# Patient Record
Sex: Female | Born: 1965
Health system: Southern US, Community
[De-identification: ages and names within clinical notes are randomized; demographics above are authoritative.]

---

## 2004-01-10 ENCOUNTER — Other Ambulatory Visit: Admission: RE | Admit: 2004-01-10 | Discharge: 2004-01-10 | Payer: Self-pay | Admitting: Family Medicine

## 2009-09-11 ENCOUNTER — Encounter: Admission: RE | Admit: 2009-09-11 | Discharge: 2009-09-11 | Payer: Self-pay | Admitting: Family Medicine

## 2010-11-09 ENCOUNTER — Other Ambulatory Visit: Payer: Self-pay | Admitting: Family Medicine

## 2010-11-09 DIAGNOSIS — Z1231 Encounter for screening mammogram for malignant neoplasm of breast: Secondary | ICD-10-CM

## 2010-11-27 ENCOUNTER — Ambulatory Visit
Admission: RE | Admit: 2010-11-27 | Discharge: 2010-11-27 | Disposition: A | Discharge status: Home / Self Care | Payer: BC Managed Care – PPO | Source: Ambulatory Visit | Attending: Family Medicine | Admitting: Family Medicine

## 2010-11-27 DIAGNOSIS — Z1231 Encounter for screening mammogram for malignant neoplasm of breast: Secondary | ICD-10-CM

## 2010-12-01 ENCOUNTER — Other Ambulatory Visit: Payer: Self-pay | Admitting: Family Medicine

## 2010-12-01 DIAGNOSIS — R928 Other abnormal and inconclusive findings on diagnostic imaging of breast: Secondary | ICD-10-CM

## 2010-12-16 ENCOUNTER — Other Ambulatory Visit: Payer: BC Managed Care – PPO

## 2010-12-18 ENCOUNTER — Ambulatory Visit
Admission: RE | Admit: 2010-12-18 | Discharge: 2010-12-18 | Disposition: A | Discharge status: Home / Self Care | Payer: BC Managed Care – PPO | Source: Ambulatory Visit | Attending: Family Medicine | Admitting: Family Medicine

## 2010-12-18 DIAGNOSIS — R928 Other abnormal and inconclusive findings on diagnostic imaging of breast: Secondary | ICD-10-CM

## 2011-11-19 ENCOUNTER — Other Ambulatory Visit: Payer: Self-pay | Admitting: Family Medicine

## 2011-11-19 DIAGNOSIS — Z1231 Encounter for screening mammogram for malignant neoplasm of breast: Secondary | ICD-10-CM

## 2011-12-24 ENCOUNTER — Ambulatory Visit
Admission: RE | Admit: 2011-12-24 | Discharge: 2011-12-24 | Disposition: A | Discharge status: Home / Self Care | Payer: BC Managed Care – PPO | Source: Ambulatory Visit | Attending: Family Medicine | Admitting: Family Medicine

## 2011-12-24 DIAGNOSIS — Z1231 Encounter for screening mammogram for malignant neoplasm of breast: Secondary | ICD-10-CM

## 2011-12-28 ENCOUNTER — Other Ambulatory Visit: Payer: Self-pay | Admitting: Family Medicine

## 2011-12-28 DIAGNOSIS — R928 Other abnormal and inconclusive findings on diagnostic imaging of breast: Secondary | ICD-10-CM

## 2011-12-30 ENCOUNTER — Ambulatory Visit
Admission: RE | Admit: 2011-12-30 | Discharge: 2011-12-30 | Disposition: A | Discharge status: Home / Self Care | Payer: BC Managed Care – PPO | Source: Ambulatory Visit | Attending: Family Medicine | Admitting: Family Medicine

## 2011-12-30 DIAGNOSIS — R928 Other abnormal and inconclusive findings on diagnostic imaging of breast: Secondary | ICD-10-CM

## 2012-12-21 ENCOUNTER — Other Ambulatory Visit: Payer: Self-pay | Admitting: Family Medicine

## 2012-12-21 DIAGNOSIS — Z1231 Encounter for screening mammogram for malignant neoplasm of breast: Secondary | ICD-10-CM

## 2013-01-23 ENCOUNTER — Ambulatory Visit (INDEPENDENT_AMBULATORY_CARE_PROVIDER_SITE_OTHER): Payer: BC Managed Care – PPO

## 2013-01-23 DIAGNOSIS — Z1231 Encounter for screening mammogram for malignant neoplasm of breast: Secondary | ICD-10-CM

## 2013-01-23 DIAGNOSIS — R928 Other abnormal and inconclusive findings on diagnostic imaging of breast: Secondary | ICD-10-CM

## 2013-01-30 ENCOUNTER — Other Ambulatory Visit: Payer: Self-pay | Admitting: Family Medicine

## 2013-01-30 DIAGNOSIS — R928 Other abnormal and inconclusive findings on diagnostic imaging of breast: Secondary | ICD-10-CM

## 2013-02-13 ENCOUNTER — Ambulatory Visit
Admission: RE | Admit: 2013-02-13 | Discharge: 2013-02-13 | Disposition: A | Discharge status: Home / Self Care | Payer: BC Managed Care – PPO | Source: Ambulatory Visit | Attending: Family Medicine | Admitting: Family Medicine

## 2013-02-13 DIAGNOSIS — R928 Other abnormal and inconclusive findings on diagnostic imaging of breast: Secondary | ICD-10-CM

## 2016-03-19 DIAGNOSIS — I10 Essential (primary) hypertension: Secondary | ICD-10-CM | POA: Diagnosis not present

## 2016-10-01 DIAGNOSIS — I10 Essential (primary) hypertension: Secondary | ICD-10-CM | POA: Diagnosis not present

## 2016-12-15 ENCOUNTER — Other Ambulatory Visit: Payer: Self-pay | Admitting: Family Medicine

## 2016-12-15 DIAGNOSIS — Z1231 Encounter for screening mammogram for malignant neoplasm of breast: Secondary | ICD-10-CM

## 2017-01-14 ENCOUNTER — Ambulatory Visit
Admission: RE | Admit: 2017-01-14 | Discharge: 2017-01-14 | Disposition: A | Discharge status: Home / Self Care | Payer: BLUE CROSS/BLUE SHIELD | Source: Ambulatory Visit | Attending: Family Medicine | Admitting: Family Medicine

## 2017-01-14 DIAGNOSIS — Z1231 Encounter for screening mammogram for malignant neoplasm of breast: Secondary | ICD-10-CM

## 2017-01-18 ENCOUNTER — Other Ambulatory Visit: Payer: Self-pay | Admitting: Family Medicine

## 2017-01-18 DIAGNOSIS — R928 Other abnormal and inconclusive findings on diagnostic imaging of breast: Secondary | ICD-10-CM

## 2017-01-21 ENCOUNTER — Ambulatory Visit
Admission: RE | Admit: 2017-01-21 | Discharge: 2017-01-21 | Disposition: A | Discharge status: Home / Self Care | Payer: BLUE CROSS/BLUE SHIELD | Source: Ambulatory Visit | Attending: Family Medicine | Admitting: Family Medicine

## 2017-01-21 DIAGNOSIS — R928 Other abnormal and inconclusive findings on diagnostic imaging of breast: Secondary | ICD-10-CM | POA: Diagnosis not present

## 2017-01-21 DIAGNOSIS — N6002 Solitary cyst of left breast: Secondary | ICD-10-CM | POA: Diagnosis not present

## 2017-03-04 DIAGNOSIS — R05 Cough: Secondary | ICD-10-CM | POA: Diagnosis not present

## 2017-03-04 DIAGNOSIS — J069 Acute upper respiratory infection, unspecified: Secondary | ICD-10-CM | POA: Diagnosis not present

## 2017-03-15 DIAGNOSIS — R05 Cough: Secondary | ICD-10-CM | POA: Diagnosis not present

## 2017-04-28 DIAGNOSIS — G4733 Obstructive sleep apnea (adult) (pediatric): Secondary | ICD-10-CM | POA: Diagnosis not present

## 2017-09-30 DIAGNOSIS — M25511 Pain in right shoulder: Secondary | ICD-10-CM | POA: Diagnosis not present

## 2017-09-30 DIAGNOSIS — Z1211 Encounter for screening for malignant neoplasm of colon: Secondary | ICD-10-CM | POA: Diagnosis not present

## 2017-09-30 DIAGNOSIS — I10 Essential (primary) hypertension: Secondary | ICD-10-CM | POA: Diagnosis not present

## 2017-12-14 ENCOUNTER — Other Ambulatory Visit: Payer: Self-pay | Admitting: Family Medicine

## 2017-12-14 DIAGNOSIS — Z1231 Encounter for screening mammogram for malignant neoplasm of breast: Secondary | ICD-10-CM

## 2018-01-26 ENCOUNTER — Ambulatory Visit
Admission: RE | Admit: 2018-01-26 | Discharge: 2018-01-26 | Disposition: A | Discharge status: Home / Self Care | Payer: BLUE CROSS/BLUE SHIELD | Source: Ambulatory Visit | Attending: Family Medicine | Admitting: Family Medicine

## 2018-01-26 DIAGNOSIS — Z1231 Encounter for screening mammogram for malignant neoplasm of breast: Secondary | ICD-10-CM

## 2018-12-26 ENCOUNTER — Other Ambulatory Visit: Payer: Self-pay | Admitting: Family Medicine

## 2018-12-26 DIAGNOSIS — Z1231 Encounter for screening mammogram for malignant neoplasm of breast: Secondary | ICD-10-CM

## 2019-02-20 ENCOUNTER — Other Ambulatory Visit: Payer: Self-pay

## 2019-02-20 ENCOUNTER — Ambulatory Visit
Admission: RE | Admit: 2019-02-20 | Discharge: 2019-02-20 | Disposition: A | Discharge status: Home / Self Care | Payer: 59 | Source: Ambulatory Visit | Attending: Family Medicine | Admitting: Family Medicine

## 2019-02-20 DIAGNOSIS — Z1231 Encounter for screening mammogram for malignant neoplasm of breast: Secondary | ICD-10-CM

## 2019-02-21 ENCOUNTER — Other Ambulatory Visit: Payer: Self-pay | Admitting: Family Medicine

## 2019-02-21 DIAGNOSIS — R928 Other abnormal and inconclusive findings on diagnostic imaging of breast: Secondary | ICD-10-CM

## 2019-03-02 ENCOUNTER — Other Ambulatory Visit: Payer: Self-pay

## 2019-03-02 ENCOUNTER — Ambulatory Visit
Admission: RE | Admit: 2019-03-02 | Discharge: 2019-03-02 | Disposition: A | Discharge status: Home / Self Care | Payer: 59 | Source: Ambulatory Visit | Attending: Family Medicine | Admitting: Family Medicine

## 2019-03-02 ENCOUNTER — Other Ambulatory Visit: Payer: Self-pay | Admitting: Family Medicine

## 2019-03-02 DIAGNOSIS — R928 Other abnormal and inconclusive findings on diagnostic imaging of breast: Secondary | ICD-10-CM

## 2019-03-02 DIAGNOSIS — N6310 Unspecified lump in the right breast, unspecified quadrant: Secondary | ICD-10-CM

## 2019-08-16 ENCOUNTER — Other Ambulatory Visit: Payer: Self-pay

## 2019-08-16 ENCOUNTER — Ambulatory Visit (INDEPENDENT_AMBULATORY_CARE_PROVIDER_SITE_OTHER): Payer: 59

## 2019-08-16 ENCOUNTER — Encounter: Payer: Self-pay | Admitting: Podiatry

## 2019-08-16 ENCOUNTER — Ambulatory Visit: Payer: 59 | Admitting: Podiatry

## 2019-08-16 DIAGNOSIS — M7661 Achilles tendinitis, right leg: Secondary | ICD-10-CM | POA: Diagnosis not present

## 2019-08-16 MED ORDER — DICLOFENAC SODIUM 75 MG PO TBEC
75.0000 mg | DELAYED_RELEASE_TABLET | Freq: Two times a day (BID) | ORAL | 2 refills | Status: AC
Start: 2019-08-16 — End: ?

## 2019-08-16 NOTE — Patient Instructions (Signed)

## 2019-08-17 NOTE — Progress Notes (Signed)
Subjective:   Patient ID: Jenny Shelton, female   DOB: 54 y.o.   MRN: [id number]   HPI Patient presents with caregiver with pain in the back of the right heel.  States is been going on for about 6 months but getting gradually worse over the last couple months and she does stand a lot at work.  Patient states that it is hard to wear shoe gear at this time and she does not smoke likes to be active   Review of Systems  All other systems reviewed and are negative.       Objective:  Physical Exam Vitals and nursing note reviewed.  Constitutional:      Appearance: She is well-developed.  Pulmonary:     Effort: Pulmonary effort is normal.  Musculoskeletal:        General: Normal range of motion.  Skin:    General: Skin is warm.  Neurological:     Mental Status: She is alert.     Neurovascular status intact muscle strength found to be adequate range of motion within normal limits with mild equinus condition bilateral.  Patient has exquisite discomfort on the posterior medial aspect of the right heel with minimal central or lateral band involvement.  Patient has mild flatfoot deformity and no other pathology was noted currently and has good digital perfusion well oriented x3     Assessment:  Acute Achilles tendinitis right medial side that is very tender when pressed     Plan:  H&P x-ray reviewed.  I discussed aggressive conservative and they want this approach and I did explain chances of rupture associated with it.  I went ahead and I did a sterile prep of the medial side of the posterior heel and carefully injected 3 mg dexamethasone 5 mg Xylocaine and then applied air fracture walker to completely immobilize.  I explained complete immobilization and patient will be seen back for [place] to recheck again in 3 weeks and may require other treatments depending on response to conservative care  X-rays indicate fairly large spur posterior aspect right heel which was discussed with  patient with no other pathology and also placed on oral diclofenac

## 2019-08-30 ENCOUNTER — Other Ambulatory Visit: Payer: Self-pay

## 2019-08-30 ENCOUNTER — Other Ambulatory Visit: Payer: Self-pay | Admitting: Family Medicine

## 2019-08-30 ENCOUNTER — Ambulatory Visit
Admission: RE | Admit: 2019-08-30 | Discharge: 2019-08-30 | Disposition: A | Discharge status: Home / Self Care | Payer: 59 | Source: Ambulatory Visit | Attending: Family Medicine | Admitting: Family Medicine

## 2019-08-30 DIAGNOSIS — N6310 Unspecified lump in the right breast, unspecified quadrant: Secondary | ICD-10-CM

## 2019-09-13 ENCOUNTER — Other Ambulatory Visit: Payer: Self-pay

## 2019-09-13 ENCOUNTER — Ambulatory Visit: Payer: 59 | Admitting: Podiatry

## 2019-09-13 ENCOUNTER — Encounter: Payer: Self-pay | Admitting: Podiatry

## 2019-09-13 DIAGNOSIS — M7661 Achilles tendinitis, right leg: Secondary | ICD-10-CM

## 2019-09-13 NOTE — Progress Notes (Signed)
Subjective:   Patient ID: Jenny Shelton, female   DOB: 54 y.o.   MRN: [id number]   HPI Patient presents stating she is feeling a lot better with mild discomfort and has been wearing the boot almost exclusively for the last 3 weeks   ROS      Objective:  Physical Exam  Neurovascular status intact with patient's right Achilles improved at its insertion with diminishment of edema and discomfort with palpation.  Motion is better at the ankle subtalar joint     Assessment:  Doing better post Achilles tendon inflammation with immobilization medication     Plan:  H&P reviewed continue topical oral medication and stretching exercises with gradual reduction of the boot over the next 3 weeks.  Patient is discharged and will be seen back as needed

## 2019-12-13 ENCOUNTER — Ambulatory Visit (HOSPITAL_COMMUNITY)
Admission: EM | Admit: 2019-12-13 | Discharge: 2019-12-13 | Disposition: A | Discharge status: Home / Self Care | Payer: 59 | Attending: Family Medicine | Admitting: Family Medicine

## 2019-12-13 ENCOUNTER — Other Ambulatory Visit: Payer: Self-pay

## 2019-12-13 ENCOUNTER — Encounter (HOSPITAL_COMMUNITY): Payer: Self-pay | Admitting: *Deleted

## 2019-12-13 DIAGNOSIS — Z01818 Encounter for other preprocedural examination: Secondary | ICD-10-CM | POA: Insufficient documentation

## 2019-12-13 DIAGNOSIS — Z1152 Encounter for screening for COVID-19: Secondary | ICD-10-CM | POA: Diagnosis not present

## 2019-12-13 DIAGNOSIS — Z20822 Contact with and (suspected) exposure to covid-19: Secondary | ICD-10-CM | POA: Insufficient documentation

## 2019-12-13 LAB — SARS CORONAVIRUS 2 (TAT 6-24 HRS): SARS Coronavirus 2: NEGATIVE

## 2019-12-13 NOTE — ED Triage Notes (Signed)
Well Pt for travel COVID test

## 2020-03-04 ENCOUNTER — Other Ambulatory Visit: Payer: 59

## 2020-04-08 ENCOUNTER — Other Ambulatory Visit: Payer: 59

## 2020-05-26 ENCOUNTER — Other Ambulatory Visit: Payer: Self-pay

## 2020-05-26 ENCOUNTER — Ambulatory Visit
Admission: RE | Admit: 2020-05-26 | Discharge: 2020-05-26 | Disposition: A | Discharge status: Home / Self Care | Payer: 59 | Source: Ambulatory Visit | Attending: Family Medicine | Admitting: Family Medicine

## 2020-05-26 DIAGNOSIS — N6310 Unspecified lump in the right breast, unspecified quadrant: Secondary | ICD-10-CM

## 2020-12-03 ENCOUNTER — Other Ambulatory Visit: Payer: Self-pay

## 2020-12-03 ENCOUNTER — Encounter: Payer: Self-pay | Admitting: Podiatry

## 2020-12-03 ENCOUNTER — Ambulatory Visit (INDEPENDENT_AMBULATORY_CARE_PROVIDER_SITE_OTHER): Payer: 59

## 2020-12-03 ENCOUNTER — Ambulatory Visit: Payer: 59 | Admitting: Podiatry

## 2020-12-03 DIAGNOSIS — M7661 Achilles tendinitis, right leg: Secondary | ICD-10-CM

## 2020-12-03 MED ORDER — TRIAMCINOLONE ACETONIDE 10 MG/ML IJ SUSP
10.0000 mg | Freq: Once | INTRAMUSCULAR | Status: AC
Start: 2020-12-03 — End: 2020-12-03
  Administered 2020-12-03: 16:00:00 10 mg

## 2020-12-04 NOTE — Progress Notes (Signed)
Subjective:   Patient ID: Jenny Shelton, female   DOB: 55 y.o.   MRN: [id number]   HPI Patient presents stating she has had a reoccurrence of pain in her right Achilles tendon that we treated about 16 months ago.  States its been very sore and the previous treatment was very successful   ROS      Objective:  Physical Exam  Neurovascular status intact with inflammation pain of the posterior Achilles lateral side at the insertion into the calcaneus with fluid buildup     Assessment:  Acute Achilles tendinitis right     Plan:  H&P x-ray reviewed condition discussed and I discussed injection and chances for rupture associated with it.  They are willing to accept risk of this procedure and I did do a sterile prep and injected the lateral side being careful not to put it into the center or medial side of the tendon 3 mg dexamethasone 5 mg Xylocaine 2 mg Kenalog advised on boot usage ice therapy reduced activity and reappoint as symptoms indicate  X-rays indicate small spur no indication stress fracture arthritis associated with condition

## 2021-04-13 ENCOUNTER — Other Ambulatory Visit: Payer: Self-pay | Admitting: Family Medicine

## 2021-04-13 DIAGNOSIS — Z09 Encounter for follow-up examination after completed treatment for conditions other than malignant neoplasm: Secondary | ICD-10-CM

## 2021-05-27 ENCOUNTER — Ambulatory Visit
Admission: RE | Admit: 2021-05-27 | Discharge: 2021-05-27 | Disposition: A | Discharge status: Home / Self Care | Payer: 59 | Source: Ambulatory Visit | Attending: Family Medicine | Admitting: Family Medicine

## 2021-05-27 DIAGNOSIS — Z09 Encounter for follow-up examination after completed treatment for conditions other than malignant neoplasm: Secondary | ICD-10-CM

## 2021-10-22 DIAGNOSIS — E785 Hyperlipidemia, unspecified: Secondary | ICD-10-CM | POA: Diagnosis not present

## 2021-10-22 DIAGNOSIS — Z79899 Other long term (current) drug therapy: Secondary | ICD-10-CM | POA: Diagnosis not present

## 2021-10-22 DIAGNOSIS — I10 Essential (primary) hypertension: Secondary | ICD-10-CM | POA: Diagnosis not present

## 2021-10-22 DIAGNOSIS — E1159 Type 2 diabetes mellitus with other circulatory complications: Secondary | ICD-10-CM | POA: Diagnosis not present

## 2021-10-22 DIAGNOSIS — Z23 Encounter for immunization: Secondary | ICD-10-CM | POA: Diagnosis not present

## 2021-11-09 DIAGNOSIS — L659 Nonscarring hair loss, unspecified: Secondary | ICD-10-CM | POA: Diagnosis not present

## 2022-04-13 ENCOUNTER — Other Ambulatory Visit: Payer: Self-pay | Admitting: Family Medicine

## 2022-04-13 DIAGNOSIS — Z1231 Encounter for screening mammogram for malignant neoplasm of breast: Secondary | ICD-10-CM

## 2022-04-22 DIAGNOSIS — E1159 Type 2 diabetes mellitus with other circulatory complications: Secondary | ICD-10-CM | POA: Diagnosis not present

## 2022-04-22 DIAGNOSIS — E785 Hyperlipidemia, unspecified: Secondary | ICD-10-CM | POA: Diagnosis not present

## 2022-04-22 DIAGNOSIS — I10 Essential (primary) hypertension: Secondary | ICD-10-CM | POA: Diagnosis not present

## 2022-04-22 DIAGNOSIS — H6120 Impacted cerumen, unspecified ear: Secondary | ICD-10-CM | POA: Diagnosis not present

## 2022-05-31 ENCOUNTER — Ambulatory Visit
Admission: RE | Admit: 2022-05-31 | Discharge: 2022-05-31 | Disposition: A | Discharge status: Home / Self Care | Payer: BC Managed Care – PPO | Source: Ambulatory Visit | Attending: Family Medicine | Admitting: Family Medicine

## 2022-05-31 DIAGNOSIS — Z1231 Encounter for screening mammogram for malignant neoplasm of breast: Secondary | ICD-10-CM

## 2022-08-10 DIAGNOSIS — K640 First degree hemorrhoids: Secondary | ICD-10-CM | POA: Diagnosis not present

## 2022-08-10 DIAGNOSIS — K5900 Constipation, unspecified: Secondary | ICD-10-CM | POA: Diagnosis not present

## 2022-10-12 DIAGNOSIS — Z23 Encounter for immunization: Secondary | ICD-10-CM | POA: Diagnosis not present

## 2022-10-12 DIAGNOSIS — E1159 Type 2 diabetes mellitus with other circulatory complications: Secondary | ICD-10-CM | POA: Diagnosis not present

## 2022-10-12 DIAGNOSIS — I10 Essential (primary) hypertension: Secondary | ICD-10-CM | POA: Diagnosis not present

## 2022-10-12 DIAGNOSIS — E785 Hyperlipidemia, unspecified: Secondary | ICD-10-CM | POA: Diagnosis not present

## 2022-12-14 DIAGNOSIS — L209 Atopic dermatitis, unspecified: Secondary | ICD-10-CM | POA: Diagnosis not present

## 2022-12-20 DIAGNOSIS — Z1211 Encounter for screening for malignant neoplasm of colon: Secondary | ICD-10-CM | POA: Diagnosis not present

## 2022-12-20 DIAGNOSIS — K5730 Diverticulosis of large intestine without perforation or abscess without bleeding: Secondary | ICD-10-CM | POA: Diagnosis not present

## 2022-12-20 DIAGNOSIS — K648 Other hemorrhoids: Secondary | ICD-10-CM | POA: Diagnosis not present

## 2023-02-23 DIAGNOSIS — L305 Pityriasis alba: Secondary | ICD-10-CM | POA: Diagnosis not present

## 2023-03-10 DIAGNOSIS — L305 Pityriasis alba: Secondary | ICD-10-CM | POA: Diagnosis not present

## 2023-03-10 DIAGNOSIS — Z419 Encounter for procedure for purposes other than remedying health state, unspecified: Secondary | ICD-10-CM | POA: Diagnosis not present

## 2023-04-11 DIAGNOSIS — I10 Essential (primary) hypertension: Secondary | ICD-10-CM | POA: Diagnosis not present

## 2023-04-11 DIAGNOSIS — E785 Hyperlipidemia, unspecified: Secondary | ICD-10-CM | POA: Diagnosis not present

## 2023-04-11 DIAGNOSIS — E1159 Type 2 diabetes mellitus with other circulatory complications: Secondary | ICD-10-CM | POA: Diagnosis not present

## 2023-04-22 ENCOUNTER — Other Ambulatory Visit: Payer: Self-pay | Admitting: Family Medicine

## 2023-04-22 DIAGNOSIS — Z1231 Encounter for screening mammogram for malignant neoplasm of breast: Secondary | ICD-10-CM

## 2023-06-01 ENCOUNTER — Ambulatory Visit
Admission: RE | Admit: 2023-06-01 | Discharge: 2023-06-01 | Disposition: A | Discharge status: Home / Self Care | Payer: Self-pay | Source: Ambulatory Visit | Attending: Family Medicine | Admitting: Family Medicine

## 2023-06-01 DIAGNOSIS — Z1231 Encounter for screening mammogram for malignant neoplasm of breast: Secondary | ICD-10-CM

## 2023-09-06 DIAGNOSIS — E785 Hyperlipidemia, unspecified: Secondary | ICD-10-CM | POA: Diagnosis not present

## 2023-09-06 DIAGNOSIS — I10 Essential (primary) hypertension: Secondary | ICD-10-CM | POA: Diagnosis not present

## 2023-09-06 DIAGNOSIS — E1159 Type 2 diabetes mellitus with other circulatory complications: Secondary | ICD-10-CM | POA: Diagnosis not present

## 2023-09-07 DIAGNOSIS — E785 Hyperlipidemia, unspecified: Secondary | ICD-10-CM | POA: Diagnosis not present

## 2023-09-07 DIAGNOSIS — I10 Essential (primary) hypertension: Secondary | ICD-10-CM | POA: Diagnosis not present

## 2023-09-07 DIAGNOSIS — E1159 Type 2 diabetes mellitus with other circulatory complications: Secondary | ICD-10-CM | POA: Diagnosis not present

## 2023-12-28 IMAGING — MG DIGITAL DIAGNOSTIC BILAT W/ TOMO W/ CAD
6 of 10 series · 6 of 30 positions shown · non-contrast
Comparison: Previous exam(s).

CLINICAL DATA: Two year follow-up for probably benign RIGHT breast
masses.

EXAM:
DIGITAL DIAGNOSTIC BILATERAL MAMMOGRAM WITH TOMOSYNTHESIS AND CAD;
ULTRASOUND RIGHT BREAST LIMITED
TECHNIQUE: Bilateral digital diagnostic mammography and breast tomosynthesis
was performed. The images were evaluated with computer-aided
detection.; Targeted ultrasound examination of the right breast was
performed

[R MLO synth-2D (1 of 2)]
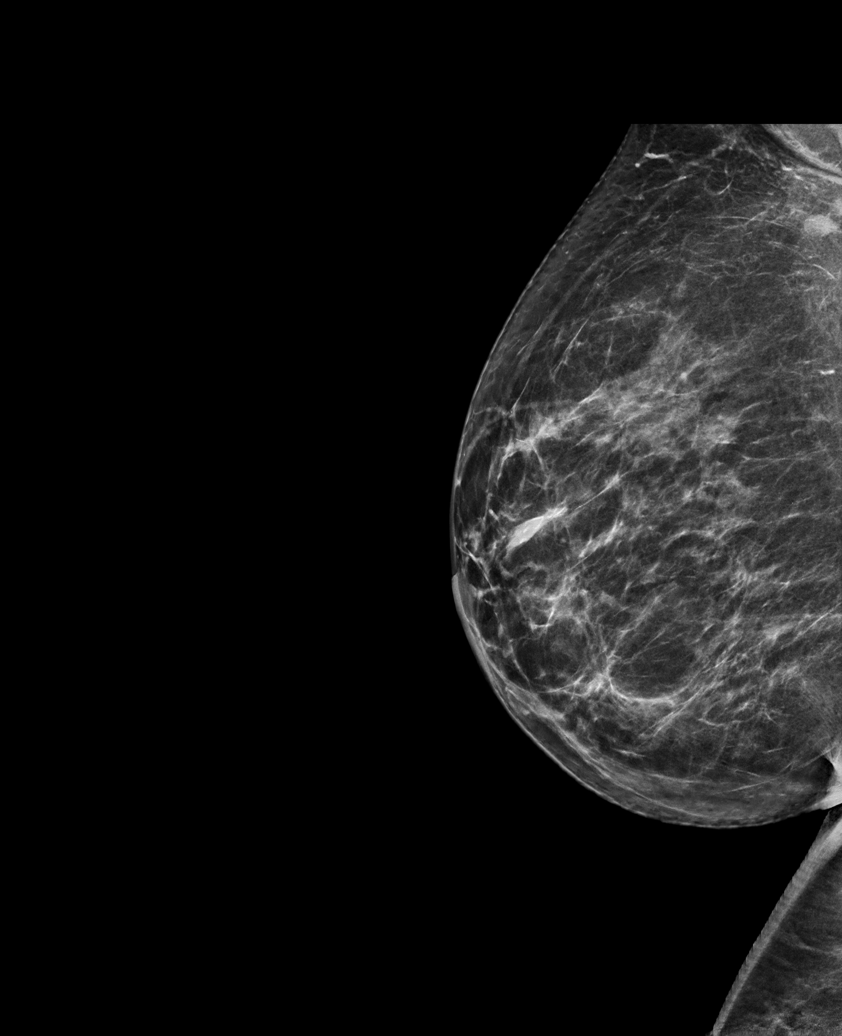

[L MLO synth-2D]
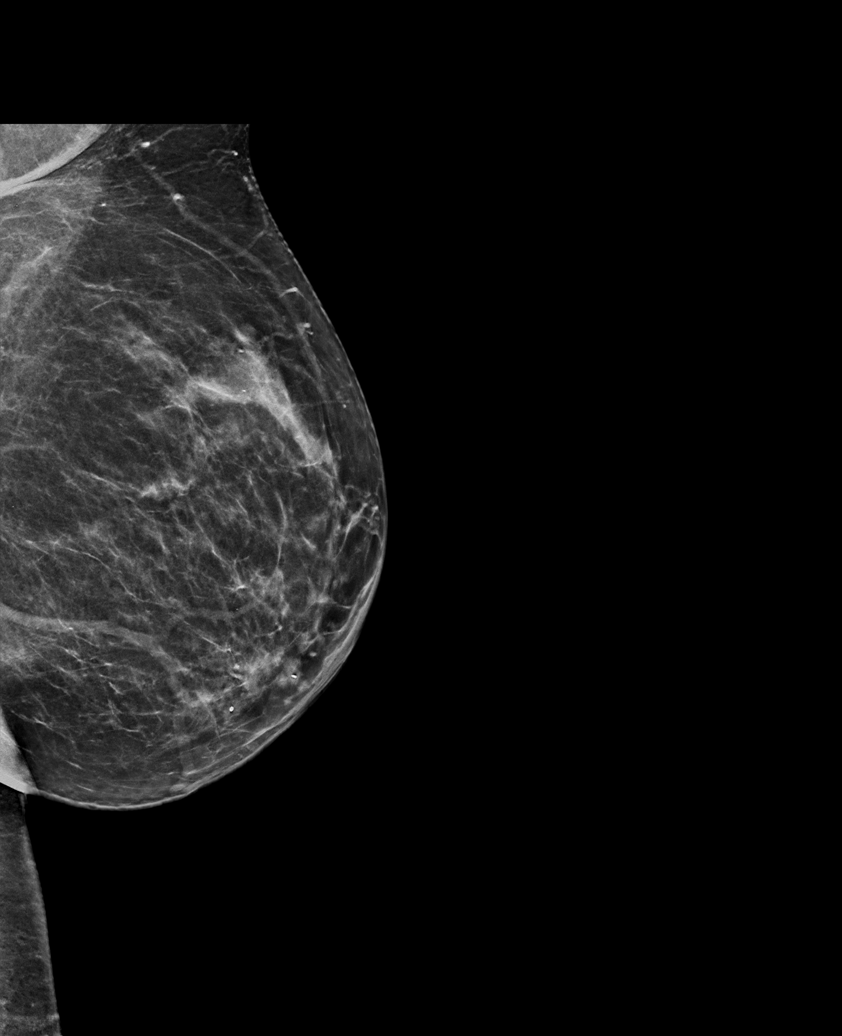

[R MLO synth-2D (2 of 2)]
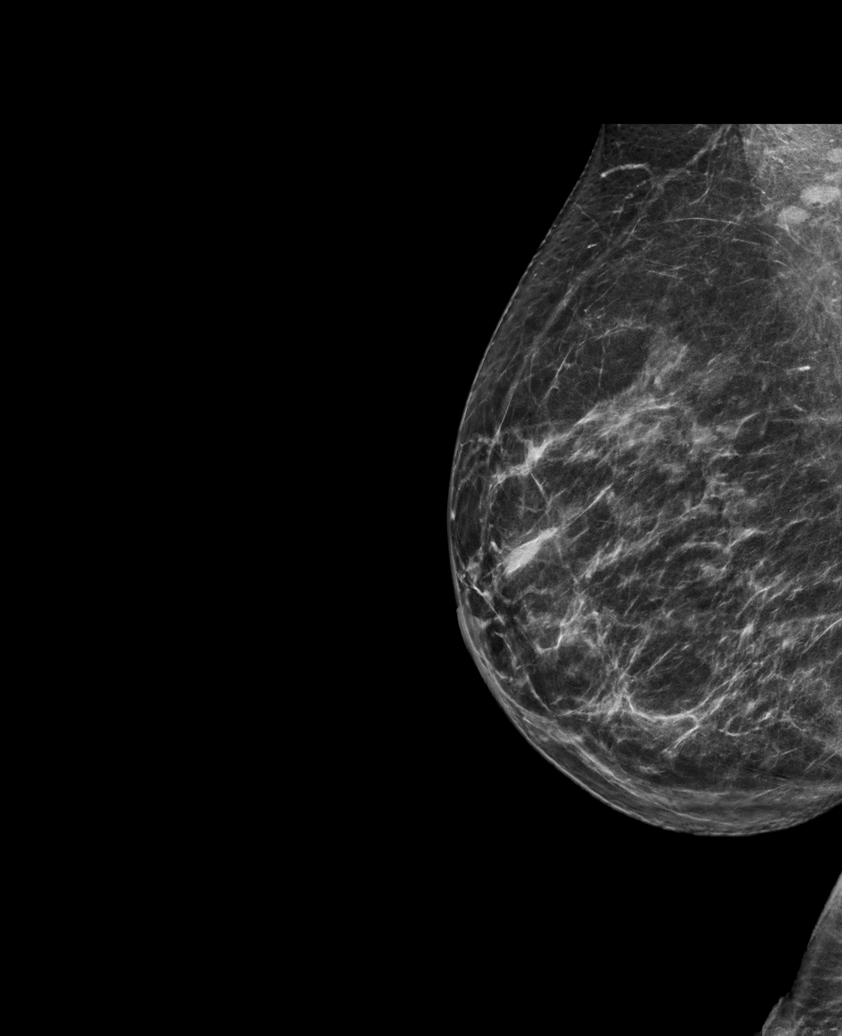

[L CC synth-2D]
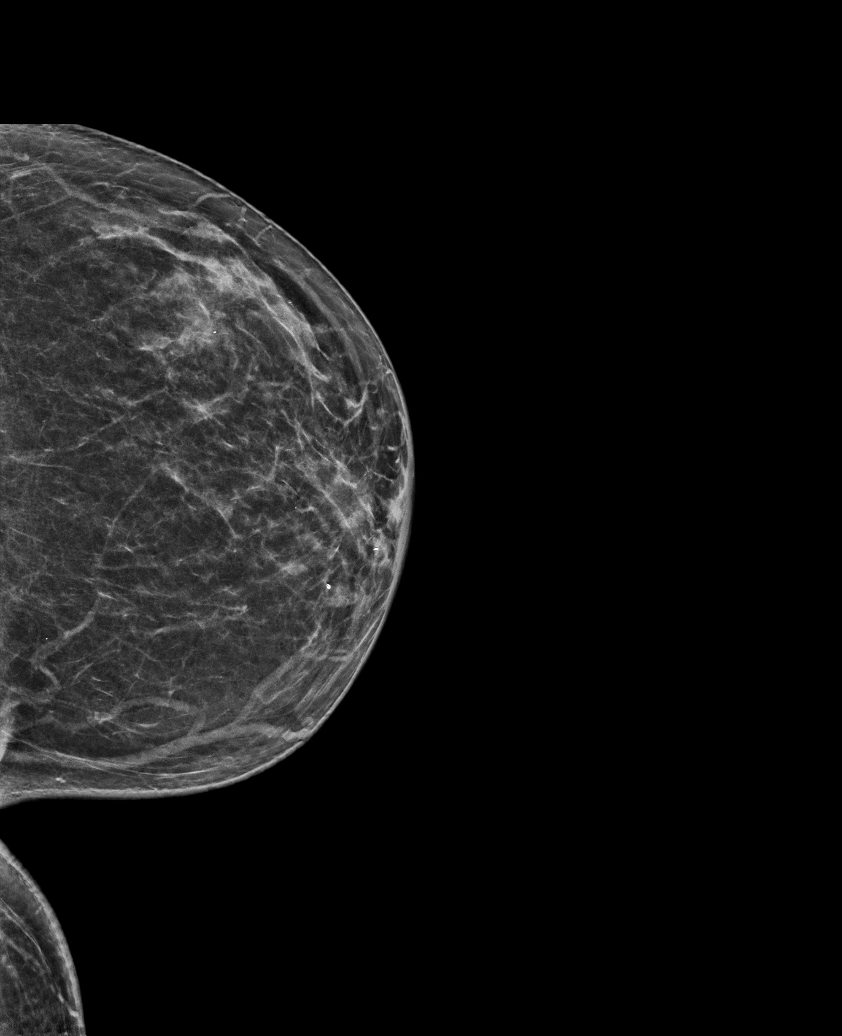

[R CC synth-2D]
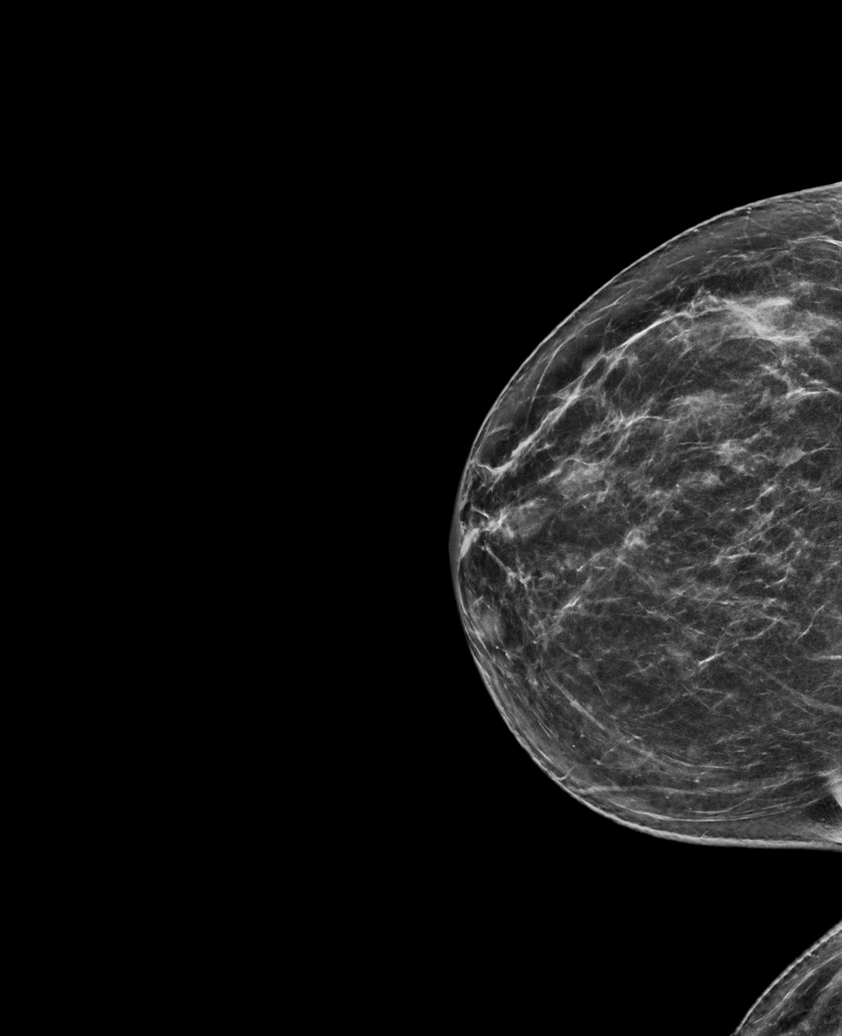

[R MLO tomo · tomo slice 36/71.0]
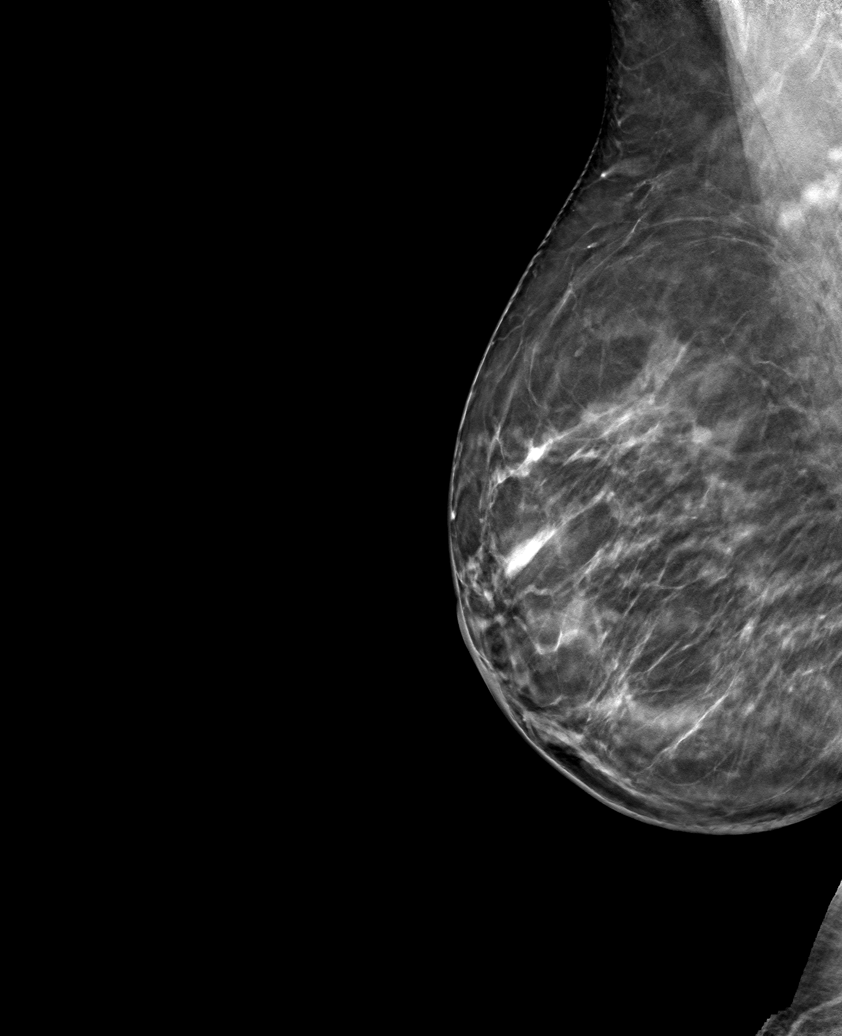

[6 of 30 positions shown; findings below may reference images not displayed]

ACR Breast Density Category c: The breast tissue is heterogeneously
dense, which may obscure small masses.
FINDINGS: RIGHT BREAST:

Mammogram: A oval partially obscured mass is identified in the
periareolar LOWER INNER QUADRANT of the RIGHT breast and further
evaluated with ultrasound. Mammographic images were processed with
CAD.

Ultrasound: Targeted ultrasound is performed, showing a group of
microcysts in the 11 o'clock location of the RIGHT breast 3
centimeters from the nipple which measures 0.7 x 0.7 x
centimeters. In the 12 o'clock location 2 centimeters from nipple,
small group of cyst is 0.3 x 0 3 x 0 2 centimeters. Lesions are
smaller compared with prior studies.

In the 5 o'clock location 2 centimeters from nipple, a cyst with
internal septations is 1.1 x 1.1 x 0.5 centimeters. No solid masses
or areas of acoustic shadowing.

LEFT BREAST:

Mammogram: No suspicious mass, distortion, or microcalcifications
are identified to suggest presence of malignancy. Mammographic
images were processed with CAD.
IMPRESSION: 1.  No mammographic or ultrasound evidence for malignancy.
2. Benign fibrocystic changes in the RIGHT breast.

RECOMMENDATION:
Screening mammogram in one year.(Code:Z1-I-KVY)

I have discussed the findings and recommendations with the patient.
If applicable, a reminder letter will be sent to the patient
regarding the next appointment.

BI-RADS CATEGORY  2: Benign.
